# Patient Record
Sex: Male | Born: 1975 | Race: White | Hispanic: No
Health system: Midwestern US, Community
[De-identification: ages and names within clinical notes are randomized; demographics above are authoritative.]

---

## 2000-06-12 ENCOUNTER — Emergency Department (HOSPITAL_COMMUNITY): Admission: EM | Admit: 2000-06-12 | Discharge: 2000-06-12 | Payer: Self-pay | Admitting: Emergency Medicine

## 2003-12-01 ENCOUNTER — Emergency Department (HOSPITAL_COMMUNITY): Admission: EM | Admit: 2003-12-01 | Discharge: 2003-12-01 | Payer: Self-pay | Admitting: Emergency Medicine

## 2003-12-27 ENCOUNTER — Emergency Department (HOSPITAL_COMMUNITY): Admission: AD | Admit: 2003-12-27 | Discharge: 2003-12-27 | Payer: Self-pay | Admitting: Internal Medicine

## 2014-02-25 MED ORDER — IBUPROFEN 800 MG TAB
800 mg | ORAL_TABLET | Freq: Four times a day (QID) | ORAL | Status: AC | PRN
Start: 2014-02-25 — End: 2014-03-04

## 2014-02-25 NOTE — Other (Signed)
Patient is a 38 y.o. male presenting with foot injury. The history is provided by the patient.   Foot Injury   This is a new problem. The current episode started 3 to 5 hours ago. The problem has been gradually improving. The pain is present in the right foot. The quality of the pain is described as aching. The pain is at a severity of 7/10. The pain is moderate. Associated symptoms comments: Pain on walking with local soreness.  And swelling . The symptoms are aggravated by palpation. He has tried cold for the symptoms. There has been a history of trauma (see note).        No past medical history on file.     No past surgical history on file.      No family history on file.     History     Social History   ??? Marital Status: UNKNOWN     Spouse Name: N/A     Number of Children: N/A   ??? Years of Education: N/A     Occupational History   ??? Not on file.     Social History Main Topics   ??? Smoking status: Not on file   ??? Smokeless tobacco: Not on file   ??? Alcohol Use: Not on file   ??? Drug Use: Not on file   ??? Sexual Activity: Not on file     Other Topics Concern   ??? Not on file     Social History Narrative   ??? No narrative on file                ALLERGIES: Review of patient's allergies indicates no known allergies.    Filed Vitals:    02/25/14 1154   BP: 129/72   Pulse: 68   Temp: 98.3 ??F (36.8 ??C)   Resp: 18   Height: 6' (1.829 m)   Weight: 110.224 kg (243 lb)   SpO2: 99%       Physical Exam   Constitutional: No distress.   Musculoskeletal:        Right ankle: Normal.        Right foot: There is tenderness and bony tenderness. There is normal range of motion.        Feet:    Nursing note and vitals reviewed.      MDM    Procedures

## 2014-04-01 MED ORDER — ETODOLAC 400 MG TAB
400 mg | ORAL_TABLET | Freq: Two times a day (BID) | ORAL | Status: AC
Start: 2014-04-01 — End: 2014-04-06

## 2014-04-01 MED ORDER — CYCLOBENZAPRINE 5 MG TAB
5 mg | ORAL_TABLET | Freq: Three times a day (TID) | ORAL | Status: AC
Start: 2014-04-01 — End: 2014-04-08

## 2014-04-01 NOTE — Other (Signed)
Patient is a 38 y.o. male presenting with back pain. The history is provided by the patient.   Back Pain   This is a new problem. Episode onset: lower right side back pain that started monday after bending at work. The problem has not changed since onset.The problem occurs constantly. Patient reports not work related injury.The pain is associated with no known injury. The pain is present in the lumbar spine and right side. The quality of the pain is described as aching. The pain does not radiate. The pain is at a severity of 8/10. The pain is moderate. The symptoms are aggravated by certain positions, bending and twisting. Stiffness is present all day. Pertinent negatives include no chest pain, no fever, no numbness, no weight loss, no headaches, no abdominal pain, no abdominal swelling, no bowel incontinence, no perianal numbness, no bladder incontinence, no dysuria, no pelvic pain, no leg pain, no paresthesias, no paresis, no tingling and no weakness. He has tried nothing for the symptoms. The treatment provided no relief.        No past medical history on file.     No past surgical history on file.      No family history on file.     History     Social History   ??? Marital Status: UNKNOWN     Spouse Name: N/A     Number of Children: N/A   ??? Years of Education: N/A     Occupational History   ??? Not on file.     Social History Main Topics   ??? Smoking status: Not on file   ??? Smokeless tobacco: Not on file   ??? Alcohol Use: Not on file   ??? Drug Use: Not on file   ??? Sexual Activity: Not on file     Other Topics Concern   ??? Not on file     Social History Narrative   ??? No narrative on file                ALLERGIES: Review of patient's allergies indicates no known allergies.    Review of Systems   Constitutional: Negative for fever and weight loss.   Cardiovascular: Negative for chest pain.   Gastrointestinal: Negative for abdominal pain and bowel incontinence.    Genitourinary: Negative for bladder incontinence, dysuria and pelvic pain.   Musculoskeletal: Positive for back pain.   Neurological: Negative for tingling, weakness, numbness, headaches and paresthesias.   All other systems reviewed and are negative.      Filed Vitals:    04/01/14 1432   BP: 122/70   Pulse: 54   Temp: 97.8 ??F (36.6 ??C)   Resp: 18   Height: 6' (1.829 m)   Weight: 110.224 kg (243 lb)   SpO2: 97%       Physical Exam   Constitutional: He is oriented to person, place, and time. He appears well-developed and well-nourished.   HENT:   Head: Normocephalic and atraumatic.   Eyes: Pupils are equal, round, and reactive to light.   Neck: Normal range of motion. Neck supple.   Cardiovascular: Normal rate and regular rhythm.    Pulmonary/Chest: Effort normal and breath sounds normal. No respiratory distress. He has no wheezes.   Abdominal: Soft. Bowel sounds are normal. He exhibits no distension. There is no tenderness. There is no rebound.   Musculoskeletal: Normal range of motion. He exhibits tenderness.   Lower back pain, pain increased upon palpation. Pain more on  The  right than the left.  No deformity.  Negative leg lift. Pain Does not radiate  ROM limited guarded with movement   Neurological: He is alert and oriented to person, place, and time.   No numbness or tingling to lower extremities  Continent of bowel and bladder  +sensation  +pulses   Skin: Skin is warm and dry.   Psychiatric: He has a normal mood and affect. His behavior is normal.   Nursing note and vitals reviewed.      MDM     Differential Diagnosis; Clinical Impression; Plan:     Muscle spasm      Procedures

## 2014-10-10 DIAGNOSIS — F29 Unspecified psychosis not due to a substance or known physiological condition: Secondary | ICD-10-CM | POA: Insufficient documentation

## 2014-10-11 DIAGNOSIS — R748 Abnormal levels of other serum enzymes: Secondary | ICD-10-CM | POA: Insufficient documentation

## 2014-10-11 DIAGNOSIS — R7989 Other specified abnormal findings of blood chemistry: Secondary | ICD-10-CM | POA: Insufficient documentation

## 2014-11-18 DIAGNOSIS — F319 Bipolar disorder, unspecified: Secondary | ICD-10-CM | POA: Insufficient documentation

## 2015-03-09 ENCOUNTER — Ambulatory Visit (INDEPENDENT_AMBULATORY_CARE_PROVIDER_SITE_OTHER): Payer: BLUE CROSS/BLUE SHIELD | Admitting: Sports Medicine

## 2015-03-09 ENCOUNTER — Encounter: Payer: Self-pay | Admitting: Sports Medicine

## 2015-03-09 VITALS — BP 132/84 | HR 94 | Ht 72.0 in | Wt 194.0 lb

## 2015-03-09 DIAGNOSIS — M5136 Other intervertebral disc degeneration, lumbar region: Secondary | ICD-10-CM | POA: Insufficient documentation

## 2015-03-09 DIAGNOSIS — M51369 Other intervertebral disc degeneration, lumbar region without mention of lumbar back pain or lower extremity pain: Secondary | ICD-10-CM | POA: Insufficient documentation

## 2015-03-09 MED ORDER — MELOXICAM 15 MG PO TABS: ORAL_TABLET | ORAL | Status: DC

## 2015-03-09 NOTE — Assessment & Plan Note (Addendum)
With what sounds to be an L4-L5 protrusion. Patient is finishing a taper of prednisone. Adding formal physical therapy, meloxicam. He already had an MRI, through [facility]

## 2015-03-09 NOTE — Progress Notes (Signed)
   Subjective:    I'm seeing this patient as a consultation for:  Dr. Luna Glasgow, [facility]  CC: low back pain  HPI: This is a pleasant 39 year old male, over the past several days to a week he's had increasing low back pain, he was seen in the emergency department where x-rays showed some mild lumbar degenerative disc disease, he was given some narcotics, and referred for outpatient workup. Unfortunately he continued to have pain and demanded an MRI, that apparently showed multilevel degenerative disc disease not unexpectedly, he was referred to surgery switch the referral to me. Pain is improving, he denies any bowel or bladder dysfunction or saddle numbness. Pain is minimally radicular down the left side but not past the knee, and pain is predominantly axial, discogenic, worse with flexion and Valsalva.  Past medical history, Surgical history, Family history not pertinant except as noted below, Social history, Allergies, and medications have been entered into the medical record, reviewed, and no changes needed.   Review of Systems: No headache, visual changes, nausea, vomiting, diarrhea, constipation, dizziness, abdominal pain, skin rash, fevers, chills, night sweats, weight loss, swollen lymph nodes, body aches, joint swelling, muscle aches, chest pain, shortness of breath, mood changes, visual or auditory hallucinations.   Objective:   General: Well Developed, well nourished, and in no acute distress.  Neuro/Psych: Alert and oriented x3, extra-ocular muscles intact, able to move all 4 extremities, sensation grossly intact. Skin: Warm and dry, no rashes noted.  Respiratory: Not using accessory muscles, speaking in full sentences, trachea midline.  Cardiovascular: Pulses palpable, no extremity edema. Abdomen: Does not appear distended. Back Exam:  Inspection: Unremarkable  Motion: Flexion 45 deg, Extension 45 deg, Side Bending to 45 deg bilaterally,  Rotation to 45 deg bilaterally    SLR laying: Negative  XSLR laying: Negative  Palpable tenderness: None. FABER: negative. Sensory change: Gross sensation intact to all lumbar and sacral dermatomes.  Reflexes: 2+ at both patellar tendons, 2+ at achilles tendons, Babinski's downgoing.  Strength at foot  Plantar-flexion: 5/5 Dorsi-flexion: 5/5 Eversion: 5/5 Inversion: 5/5  Leg strength  Quad: 5/5 Hamstring: 5/5 Hip flexor: 5/5 Hip abductors: 5/5  Gait unremarkable.  Impression and Recommendations:   This case required medical decision making of moderate complexity.

## 2015-03-17 ENCOUNTER — Ambulatory Visit: Payer: BLUE CROSS/BLUE SHIELD | Admitting: Rehabilitative and Restorative Service Providers"

## 2015-03-18 ENCOUNTER — Encounter: Payer: Self-pay | Admitting: Physical Therapy

## 2015-03-18 ENCOUNTER — Ambulatory Visit (INDEPENDENT_AMBULATORY_CARE_PROVIDER_SITE_OTHER): Payer: Worker's Compensation | Admitting: Physical Therapy

## 2015-03-18 DIAGNOSIS — M2560 Stiffness of unspecified joint, not elsewhere classified: Secondary | ICD-10-CM | POA: Diagnosis not present

## 2015-03-18 DIAGNOSIS — M545 Low back pain, unspecified: Secondary | ICD-10-CM

## 2015-03-18 DIAGNOSIS — M79605 Pain in left leg: Secondary | ICD-10-CM

## 2015-03-18 DIAGNOSIS — M2569 Stiffness of other specified joint, not elsewhere classified: Secondary | ICD-10-CM

## 2015-03-18 DIAGNOSIS — R29898 Other symptoms and signs involving the musculoskeletal system: Secondary | ICD-10-CM

## 2015-03-18 DIAGNOSIS — M6281 Muscle weakness (generalized): Secondary | ICD-10-CM | POA: Diagnosis not present

## 2015-03-18 NOTE — Therapy (Signed)
[facility]  [place], [place], [address] Phone: [phone]   Fax:  [phone]  Physical Therapy Evaluation  Patient Details  Name: Seth Rose MRN: [id number] Date of Birth: 06-13-1976 Referring Provider:  Silverio Decamp,*  Encounter Date: 03/18/2015      PT End of Session - 03/18/15 1525    Visit Number 1   Number of Visits 8   Date for PT Re-Evaluation 04/15/15   PT Start Time 1525   PT Stop Time 1629   PT Time Calculation (min) 64 min      History reviewed. No pertinent past medical history.  History reviewed. No pertinent past surgical history.  There were no vitals filed for this visit.  Visit Diagnosis:  Lumbar pain with radiation down left leg - Plan: PT plan of care cert/re-cert  Weakness of back - Plan: PT plan of care cert/re-cert  Back stiffness - Plan: PT plan of care cert/re-cert      Subjective Assessment - 03/18/15 1528    Subjective Pt reports back pain that started 2-3 wks ago, missed work last week due to this. 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  He had immediate pain, went to ED. Was issued pain medication and muscle relaxers.    Pertinent History Didn't respond well to pain meds so returned to ED. Had an MRI and received some more medication via IV to help reduce pain and swelling. Was on prednisone also initially.    How long can you sit comfortably? tolerates sitting a lot better now, the transition to standing is difficult.    How long can you walk comfortably? no limitations at this time   Diagnostic tests MRI disc at L4-5   Patient Stated Goals return to normal, move freely.    Currently in Pain? Yes   Pain Score 3    Pain Location Back   Pain Orientation Left   Pain Descriptors / Indicators Aching;Squeezing   Pain Type Acute pain   Pain Radiating Towards will catch at times and pain travel to the  buttocks, occassionally into the Lt LE. Intemittent numbness in his thighs   Pain Onset 1 to 4 weeks ago   Pain Relieving Factors walking and gentle moving around, medications help some, pool exercise.             [facility] PT Assessment - 03/18/15 0001    Assessment   Medical Diagnosis Lumbar DDD   Onset Date/Surgical Date 03/05/15   Hand Dominance Right   Next MD Visit 04/06/15   Prior Therapy none   Precautions   Precautions None   Balance Screen   Has the patient fallen in the past 6 months No   Has the patient had a decrease in activity level because of a fear of falling?  No   Is the patient reluctant to leave their home because of a fear of falling?  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#, alot of walking, driving a fork lift   Leisure swim/exercise in the pool   Observation/Other Assessments   Focus on Therapeutic Outcomes (FOTO)  45% limited   Posture/Postural Control  Posture/Postural Control Postural limitations   Postural Limitations Forward head;Rounded Shoulders  decreased Rt shoulder complex   ROM / Strength   AROM / PROM / Strength AROM;Strength   AROM   AROM Assessment Site Lumbar;Hip   Lumbar Flexion to mid thigh  with pain   Lumbar Extension decreased 50%  minimal lumbar motion   Lumbar - Right Side Bend WNL   Lumbar - Left Side Bend WNL  pain in low back   Lumbar - Right Rotation WNL   Lumbar - Left Rotation WNL   Strength   Overall Strength Comments knees/ankles WNL  multifidis good on Rt , Lt fair.    Strength Assessment Site Hip   Right/Left Hip --  bilat WNL   Flexibility   Soft Tissue Assessment /Muscle Length yes   Hamstrings Rt at 75 degrees, unable to assess Lt d/t (+) SLR    Palpation    Spinal mobility hypomobile in lower lumbar spine.    Palpation comment tightness in Lt lumbar paraspinals, tenderness into the upper gluts   Special Tests    Special Tests Lumbar   Lumbar Tests Slump Test;Straight Leg Raise   Slump test   Findings Positive   Side Left   Straight Leg Raise   Findings Positive   Side  Left   Comment at 30 degrees                   [facility] Adult PT Treatment/Exercise - 03/18/15 0001    Exercises   Exercises Lumbar   Lumbar Exercises: Stretches   Single Knee to Chest Stretch 30 seconds  with pull to opposite shoulder   Lower Trunk Rotation 10 seconds   Press Ups --  10 reps   Lumbar Exercises: Prone   Other Prone Lumbar Exercises pelvic press x 10   Modalities   Modalities Ultrasound   Ultrasound   Ultrasound Location Rt upper gluts   Ultrasound Parameters combo 100%, 3.3 mhz, 1.5 w/cm2   Ultrasound Goals Pain                PT Education - 03/18/15 1607    Education provided Yes   Education Details HEP and basic body mechanics   Person(s) Educated Patient   Methods Explanation;Demonstration;Handout   Comprehension Returned demonstration;Verbalized understanding             PT Long Term Goals - 03/18/15 1730    PT LONG TERM GOAL #1   Title I with advanced HEP ( 04/15/15)   Time 4   Period Weeks   Status New   PT LONG TERM GOAL #2   Title demo full lumbar ROM without pain ( 04/15/15)   Time 4   Period Weeks   Status New   PT LONG TERM GOAL #3   Title demo strong and equal contraction of mulitifidis ( 04/15/15)   Time 4   Period Weeks   Status New   PT LONG TERM GOAL #4   Title improve FOTO =/< 23% (04/15/15)   Time 4   Period Weeks   Status New                Problem List Patient Active Problem List   Diagnosis Date Noted  . Lumbar degenerative disc disease 03/09/2015    Jeral Pinch, PT 03/18/2015, 5:36 PM  [facility]  [place], [place], [address] Phone: [phone]   Fax:  [phone]

## 2015-03-18 NOTE — Patient Instructions (Signed)
Pelvic Press   Place hands under belly between navel and pubic bone, palms up. Feel pressure on hands. Increase pressure on hands by pressing pelvis down. This is NOT a pelvic tilt. Hold _10__ seconds. Relax. Repeat 10___ times. 1-2 times a day  Stretching: Piriformis (Supine)   Pull right knee toward opposite shoulder. Hold _30-45___ seconds. Relax. Repeat _1___ times per set. Do _1___ sets per session. Do __2_ sessions per day.  Lumbar Rotation (Non-Weight Bearing)   Feet on floor, slowly rock knees from side to side in small, pain-free range of motion. Allow lower back to rotate slightly. Repeat __10__ times per set. Do _1___ sets per session. Do ___2_ sessions per day.  Press-Up   Press upper body upward, keeping hips in contact with floor. Keep lower back and buttocks relaxed. Hold _1-2___ seconds. Repeat _10___ times per set. Do __1__ sets per session. Do __2__ sessions per day  Avoid Twisting   Avoid twisting or bending back. Pivot around using foot movements, and bend at knees if needed when reaching for articles.  Getting Into / Out of Bed   Lower self to lie down on one side by raising legs and lowering head at the same time. Use arms to assist moving without twisting. Bend both knees to roll onto back if desired. To sit up, start from lying on side, and use same move-ments in reverse. Keep trunk aligned with legs  Bending    OR HINGE AT [place] at hips and knees, not back. Keep feet shoulder-width apart.   Copyright  VHI. All rights reserved.

## 2015-03-22 ENCOUNTER — Ambulatory Visit (INDEPENDENT_AMBULATORY_CARE_PROVIDER_SITE_OTHER): Payer: Worker's Compensation | Admitting: Rehabilitative and Restorative Service Providers"

## 2015-03-22 ENCOUNTER — Encounter: Payer: Self-pay | Admitting: Rehabilitative and Restorative Service Providers"

## 2015-03-22 DIAGNOSIS — M2569 Stiffness of other specified joint, not elsewhere classified: Secondary | ICD-10-CM

## 2015-03-22 DIAGNOSIS — R29898 Other symptoms and signs involving the musculoskeletal system: Secondary | ICD-10-CM

## 2015-03-22 DIAGNOSIS — M6281 Muscle weakness (generalized): Secondary | ICD-10-CM | POA: Diagnosis not present

## 2015-03-22 DIAGNOSIS — M545 Low back pain: Secondary | ICD-10-CM | POA: Diagnosis not present

## 2015-03-22 DIAGNOSIS — M2560 Stiffness of unspecified joint, not elsewhere classified: Secondary | ICD-10-CM | POA: Diagnosis not present

## 2015-03-22 DIAGNOSIS — M79605 Pain in left leg: Secondary | ICD-10-CM

## 2015-03-22 NOTE — Therapy (Signed)
[facility] [place], [place], [address] Phone: [phone]   Fax:  [phone]  Physical Therapy Treatment  Patient Details  Name: Seth Rose MRN: [id number] Date of Birth: 07-02-1976 Referring Provider:  Silverio Decamp,*  Encounter Date: 03/22/2015      PT End of Session - 03/22/15 1404    Visit Number 2   Number of Visits 8   Date for PT Re-Evaluation 04/15/15   PT Start Time 8101   PT Stop Time 1455   PT Time Calculation (min) 51 min      History reviewed. No pertinent past medical history.  History reviewed. No pertinent past surgical history.  There were no vitals filed for this visit.  Visit Diagnosis:  Lumbar pain with radiation down left leg  Weakness of back  Back stiffness      Subjective Assessment - 03/22/15 1406    Subjective Some improvement; did not sleep well - - up at 2:30 and took  Advil which seemed to help. Still having pain down leg.   Currently in Pain? Yes   Pain Score 4    Pain Location Back   Pain Orientation Left   Pain Descriptors / Indicators Aching   Pain Type Acute pain   Pain Radiating Towards pain into the lt buttock and calf   Pain Onset 1 to 4 weeks ago          [facility] Adult PT Treatment/Exercise - 03/22/15 0001    Lumbar Exercises: Stretches   Press Ups --  10 reps   Piriformis Stretch 3 reps;20 seconds  Lt foot across Rt leg   Lumbar Exercises: Supine   Ab Set --  10 sec hold 3 part core   Lumbar Exercises: Prone   Other Prone Lumbar Exercises pelvic press x 10   Modalities   Modalities Ultrasound   Cryotherapy   Number Minutes Cryotherapy 15 Minutes   Cryotherapy Location Lumbar Spine   Type of Cryotherapy Ice pack   Electrical Stimulation   Electrical Stimulation Location lumbar/Rt hip   Electrical Stimulation Action IFC   Electrical Stimulation Parameters to tolerance   Electrical Stimulation Goals Pain   Ultrasound   Ultrasound Location Rt upper gluts   Ultrasound Parameters combo   Ultrasound Goals Pain           PT Education - 03/22/15 1434    Education provided Yes   Education Details Reviewed exercises; added exercses for home; continued education re positioning   Person(s) Educated Patient   Methods Explanation;Demonstration;Tactile cues;Verbal cues;Handout   Comprehension Verbalized understanding;Returned demonstration           PT Long Term Goals - 03/22/15 1631    PT LONG TERM GOAL #1   Title I with advanced HEP ( 04/15/15)   Time 4   Period Weeks   Status On-going   PT LONG TERM GOAL #2   Title demo full lumbar ROM without pain ( 04/15/15)   Period Weeks   Status On-going   PT LONG TERM GOAL #3   Title demo strong and equal contraction of mulitifidis ( 04/15/15)   Period Weeks   Status On-going   PT LONG TERM GOAL #4   Title improve FOTO =/< 23% (04/15/15)   Time 4   Period Weeks   Status On-going           Plan - 03/22/15 1441    Clinical Impression Statement Patient reports some improvement in symptoms  since last PT visit. He continues to have back and L LE pain.    Pt will benefit from skilled therapeutic intervention in order to improve on the following deficits Decreased range of motion;Decreased endurance;Increased muscle spasms;Decreased activity tolerance;Pain;Impaired flexibility;Improper body mechanics;Decreased mobility;Decreased strength;Postural dysfunction   Rehab Potential Good   PT Frequency 2x / week   PT Duration 4 weeks   PT Treatment/Interventions ADLs/Self Care Home Management;Cryotherapy;Electrical Stimulation;Iontophoresis 4mg /ml Dexamethasone;Moist Heat;Ultrasound;Functional mobility training;Therapeutic activities;Therapeutic exercise;Neuromuscular re-education;Patient/family education;Manual techniques;Passive range of motion;Taping   PT Next Visit Plan Review exercises; progress with lumbar stabilization; spine rehab; modalities as indicated    PT Home Exercise Plan HEP    Consulted and Agree with Plan of Care Patient      Problem List Patient Active Problem List   Diagnosis Date Noted  . Lumbar degenerative disc disease 03/09/2015    Bassheva Flury P Juluis Fitzsimmons,PT, MPH 03/22/2015, 4:34 PM  [facility] [place], [place], [address] Phone: [phone]   Fax:  [phone]

## 2015-03-22 NOTE — Patient Instructions (Signed)
Abdominal Bracing With Pelvic Floor (Hook-Lying)   With neutral spine, tighten pelvic floor and abdominals(suck belly button to back bone), tighten muscles in low back at waist. Hold 10 sec. Repeat _10__ times. Do _several__ times a day.   Piriformis Stretch   Lying on back, bend knee placing foot across opposite leg, hold back flat, pull left knee across your body.  Hold _20_ seconds. Repeat _3___ times. Do _3-4___ sessions per day.

## 2015-03-26 ENCOUNTER — Ambulatory Visit (INDEPENDENT_AMBULATORY_CARE_PROVIDER_SITE_OTHER): Payer: BLUE CROSS/BLUE SHIELD | Admitting: Physical Therapy

## 2015-03-26 DIAGNOSIS — M6281 Muscle weakness (generalized): Secondary | ICD-10-CM | POA: Diagnosis not present

## 2015-03-26 DIAGNOSIS — M2560 Stiffness of unspecified joint, not elsewhere classified: Secondary | ICD-10-CM | POA: Diagnosis not present

## 2015-03-26 DIAGNOSIS — M545 Low back pain: Secondary | ICD-10-CM | POA: Diagnosis not present

## 2015-03-26 DIAGNOSIS — M79605 Pain in left leg: Secondary | ICD-10-CM

## 2015-03-26 DIAGNOSIS — R29898 Other symptoms and signs involving the musculoskeletal system: Secondary | ICD-10-CM

## 2015-03-26 DIAGNOSIS — M2569 Stiffness of other specified joint, not elsewhere classified: Secondary | ICD-10-CM

## 2015-03-26 NOTE — Therapy (Signed)
[place], [place], [address] Phone: [phone]   Fax:  [phone]  Physical Therapy Treatment  Patient Details  Name: NICKOLES GREGORI MRN: [id number] Date of Birth: June 09, 1976 Referring Provider:  Silverio Decamp,*  Encounter Date: 03/26/2015      PT End of Session - 03/26/15 1514    Visit Number 3   Number of Visits 8   Date for PT Re-Evaluation 04/15/15   PT Start Time 1512   PT Stop Time 1620   PT Time Calculation (min) 68 min   Activity Tolerance Patient tolerated treatment well      No past medical history on file.  No past surgical history on file.  There were no vitals filed for this visit.  Visit Diagnosis:  Lumbar pain with radiation down left leg  Weakness of back  Back stiffness      Subjective Assessment - 03/26/15 1514    Subjective Pt reports Lt leg was throbbing in church yesterday.  Pt reports he hasn't had any symptoms in leg; keeping moving has helped.    Currently in Pain? Yes   Pain Score 3    Pain Location Back   Pain Orientation Left;Lower   Pain Descriptors / Indicators Aching;Dull   Pain Radiating Towards not radiating into LE   Aggravating Factors  sitting    Pain Relieving Factors walking around, pool exercise             [facility] PT Assessment - 03/26/15 0001    Assessment   Medical Diagnosis Lumbar DDD   Onset Date/Surgical Date 03/05/15   Hand Dominance Right   Next MD Visit 04/06/15   Prior Therapy none                     [facility] Adult PT Treatment/Exercise - 03/26/15 0001    Transfers   Transfers --  Instructed pt on core engaged with transfers. Returned demo    Lumbar Exercises: Hydrologist 2 reps;30 seconds  each leg; pt reported referred pain LLE   Single Knee to Chest Stretch 30 seconds;2 reps   Lower Trunk Rotation 5 reps;10 seconds  trialed single knee rotation; increased symptoms   Press Ups --  10 reps   Piriformis Stretch 3 reps;30 seconds  each leg; with towel assist in supine   Lumbar Exercises: Aerobic   Stationary Bike NuStep L4: 5.5 min    Lumbar Exercises: Prone   Straight Leg Raise 10 reps  with pelvic press x 10 each leg   Other Prone Lumbar Exercises Pelvic press 5 sec hold x 5; press with unilateral knee flexion x 5 reps each leg (tactile cues required)    Lumbar Exercises: Quadruped   Madcat/Old Horse 5 reps   Madcat/Old Horse Limitations required tactile cues    Cryotherapy   Number Minutes Cryotherapy 12 Minutes   Cryotherapy Location Hip;Lumbar Spine   Type of Cryotherapy Ice pack   Electrical Stimulation   Electrical Stimulation Location lumbar/Rt hip   Electrical Stimulation Action premod to each area   Electrical Stimulation Parameters to tolerance x 12 min    Electrical Stimulation Goals Pain            PT Education - 03/26/15 1610    Education provided Yes   Education Details self care: engage core with transitions (sit to/from stand), log roll supine to/from sit, ice pack to affected area x 15 min as  needed. HEP - added exercises to pelvic press series.    Person(s) Educated Patient   Methods Handout;Explanation   Comprehension Verbalized understanding;Returned demonstration             PT Long Term Goals - 03/22/15 1631    PT LONG TERM GOAL #1   Title I with advanced HEP ( 04/15/15)   Time 4   Period Weeks   Status On-going   PT LONG TERM GOAL #2   Title demo full lumbar ROM without pain ( 04/15/15)   Period Weeks   Status On-going   PT LONG TERM GOAL #3   Title demo strong and equal contraction of mulitifidis ( 04/15/15)   Period Weeks   Status On-going   PT LONG TERM GOAL #4   Title improve FOTO =/< 23% (04/15/15)   Time 4   Period Weeks   Status On-going               Plan - 03/26/15 1614    Clinical Impression Statement Pt tolerated most exercises without increase in symptoms, except supine  Hamstring stretch (increased sciatica into calf).  Pt demo improved engagement of multifidus and trans abdominus with tactile + VC.  Progressing  towards goals.    Pt will benefit from skilled therapeutic intervention in order to improve on the following deficits Decreased range of motion;Decreased endurance;Increased muscle spasms;Decreased activity tolerance;Pain;Impaired flexibility;Improper body mechanics;Decreased mobility;Decreased strength;Postural dysfunction   Rehab Potential Good   PT Frequency 2x / week   PT Duration 4 weeks   PT Treatment/Interventions ADLs/Self Care Home Management;Cryotherapy;Electrical Stimulation;Iontophoresis 4mg /ml Dexamethasone;Moist Heat;Ultrasound;Functional mobility training;Therapeutic activities;Therapeutic exercise;Neuromuscular re-education;Patient/family education;Manual techniques;Passive range of motion;Taping   PT Next Visit Plan Continue progressive core strengthening; modalities as needed.    Consulted and Agree with Plan of Care Patient        Problem List Patient Active Problem List   Diagnosis Date Noted  . Lumbar degenerative disc disease 03/09/2015   Kerin Perna, PTA 03/26/2015 4:26 PM   [place], [place], [address] Phone: [phone]   Fax:  [phone]

## 2015-03-26 NOTE — Patient Instructions (Signed)
Pelvic Press   Place hands under belly between navel and pubic bone, palms up. Feel pressure on hands. Increase pressure on hands by pressing pelvis down. This is NOT a pelvic tilt. Hold __5_ seconds. Relax. Repeat _10__ times.  KNEE: Flexion - Prone   Hold pelvic press. Bend knee. Raise heel toward buttocks. Repeat on opposite leg. Do not raise hips. _10__ reps per set, __1_ sets per day, _3-4__ days per week * When this is mastered, pull both heels up at same time, x 10 reps.    Hip Extension (Prone)  Hold pelvic press  Lift left leg _3___ inches from floor, keeping knee locked. Repeat __10__ times per set. Do _1___ sets per session. Do _10___ sessions per day.  [facility] at [facility] [place], [place] [address]  [phone] (office) [phone] (fax)

## 2015-03-29 ENCOUNTER — Ambulatory Visit (INDEPENDENT_AMBULATORY_CARE_PROVIDER_SITE_OTHER): Payer: Worker's Compensation | Admitting: Physical Therapy

## 2015-03-29 DIAGNOSIS — M545 Low back pain, unspecified: Secondary | ICD-10-CM

## 2015-03-29 DIAGNOSIS — M6281 Muscle weakness (generalized): Secondary | ICD-10-CM | POA: Diagnosis not present

## 2015-03-29 DIAGNOSIS — M79605 Pain in left leg: Secondary | ICD-10-CM

## 2015-03-29 DIAGNOSIS — M2560 Stiffness of unspecified joint, not elsewhere classified: Secondary | ICD-10-CM | POA: Diagnosis not present

## 2015-03-29 DIAGNOSIS — R29898 Other symptoms and signs involving the musculoskeletal system: Secondary | ICD-10-CM

## 2015-03-29 DIAGNOSIS — M2569 Stiffness of other specified joint, not elsewhere classified: Secondary | ICD-10-CM

## 2015-03-29 NOTE — Therapy (Signed)
[place], [place], [address] Phone: [phone]   Fax:  [phone]  Physical Therapy Treatment  Patient Details  Name: Seth Rose MRN: [id number] Date of Birth: [birth year] Referring Provider:  Silverio Decamp,*  Encounter Date: 03/29/2015      PT End of Session - 03/29/15 1604    Visit Number 4   Number of Visits 8   Date for PT Re-Evaluation 04/15/15   PT Start Time 1602   PT Stop Time 1652   PT Time Calculation (min) 50 min   Activity Tolerance Patient tolerated treatment well      No past medical history on file.  No past surgical history on file.  There were no vitals filed for this visit.  Visit Diagnosis:  Lumbar pain with radiation down left leg  Weakness of back  Back stiffness      Subjective Assessment - 03/29/15 1604    Subjective Pt reports he still has symptoms in Lt leg, but not as frequently. Iced with frozen peas, gave some relief.    Currently in Pain? Yes   Pain Score 2    Pain Location Buttocks   Pain Orientation Left   Pain Descriptors / Indicators Aching   Aggravating Factors  sitting   Pain Relieving Factors walking             [facility] PT Assessment - 03/29/15 0001    Assessment   Medical Diagnosis Lumbar DDD   Onset Date/Surgical Date 03/05/15   Hand Dominance Right   Next MD Visit 04/06/15   Prior Therapy none                     [facility] Adult PT Treatment/Exercise - 03/29/15 0001    Lumbar Exercises: Stretches   Passive Hamstring Stretch 2 reps;30 seconds  each leg; pt reported referred pain LLE   Lumbar Exercises: Aerobic   Stationary Bike NuStep L4: 6 min    Lumbar Exercises: Prone   Other Prone Lumbar Exercises Pelvic press 5 sec hold x 5; press with unilateral knee flexion x 10 each leg; 10 with bilateral knee flexion.    Lumbar Exercises: Quadruped   Madcat/Old Horse 5 reps   Single Arm Raise Right;Left;5 reps   Straight Leg Raise 5 reps;1 second  each side    Opposite Arm/Leg Raise 5 reps;Right arm/Left leg;Left arm/Right leg   Modalities   Modalities Traction   Traction   Type of Traction Lumbar   Min (lbs) 30   Max (lbs) 55   Hold Time 60   Rest Time 20   Time 10                     PT Long Term Goals - 03/22/15 1631    PT LONG TERM GOAL #1   Title I with advanced HEP ( 04/15/15)   Time 4   Period Weeks   Status On-going   PT LONG TERM GOAL #2   Title demo full lumbar ROM without pain ( 04/15/15)   Period Weeks   Status On-going   PT LONG TERM GOAL #3   Title demo strong and equal contraction of mulitifidis ( 04/15/15)   Period Weeks   Status On-going   PT LONG TERM GOAL #4   Title improve FOTO =/< 23% (04/15/15)   Time 4   Period Weeks   Status On-going  Plan - 03/29/15 1636    Clinical Impression Statement Pt demonstrated improved core engagement with ther ex. Pt continues to experience symptoms in LLE with supine hamstring stretch.  Pt tolerated trial of traction without increase in symtpms.    Pt will benefit from skilled therapeutic intervention in order to improve on the following deficits Decreased range of motion;Decreased endurance;Increased muscle spasms;Decreased activity tolerance;Pain;Impaired flexibility;Improper body mechanics;Decreased mobility;Decreased strength;Postural dysfunction   Rehab Potential Good   PT Frequency 2x / week   PT Duration 4 weeks   PT Treatment/Interventions ADLs/Self Care Home Management;Cryotherapy;Electrical Stimulation;Iontophoresis 4mg /ml Dexamethasone;Moist Heat;Ultrasound;Functional mobility training;Therapeutic activities;Therapeutic exercise;Neuromuscular re-education;Patient/family education;Manual techniques;Passive range of motion;Taping;Traction  spoke to supervising PT regarding addition of traction to POC.    PT Next Visit Plan Continue progressive core strengthening.  Assess response to lumbar  traction.    Consulted and Agree with Plan of Care Patient     Added traction to PT POC.  Per MD orders, okay to proceed with traction as indicated. Laureen Abrahams, PT, DPT 03/29/2015 5:01 PM   Problem List Patient Active Problem List   Diagnosis Date Noted  . Lumbar degenerative disc disease 03/09/2015   Kerin Perna, PTA 03/29/2015 5:01 PM  Laureen Abrahams, PT, DPT 03/29/2015 5:01 PM  [facility] [place], [place], [address] Phone: [phone]   Fax:  [phone]

## 2015-04-02 ENCOUNTER — Ambulatory Visit (INDEPENDENT_AMBULATORY_CARE_PROVIDER_SITE_OTHER): Payer: Worker's Compensation | Admitting: Physical Therapy

## 2015-04-02 DIAGNOSIS — M79605 Pain in left leg: Secondary | ICD-10-CM

## 2015-04-02 DIAGNOSIS — M2560 Stiffness of unspecified joint, not elsewhere classified: Secondary | ICD-10-CM | POA: Diagnosis not present

## 2015-04-02 DIAGNOSIS — M6281 Muscle weakness (generalized): Secondary | ICD-10-CM

## 2015-04-02 DIAGNOSIS — M545 Low back pain, unspecified: Secondary | ICD-10-CM

## 2015-04-02 DIAGNOSIS — M2569 Stiffness of other specified joint, not elsewhere classified: Secondary | ICD-10-CM

## 2015-04-02 DIAGNOSIS — R29898 Other symptoms and signs involving the musculoskeletal system: Secondary | ICD-10-CM

## 2015-04-02 NOTE — Therapy (Signed)
[place], [place], [address] Phone: [phone]   Fax:  [phone]  Physical Therapy Treatment  Patient Details  Name: Seth Rose MRN: [id number] Date of Birth: 03-22-1976 Referring Provider:  Silverio Decamp,*  Encounter Date: 04/02/2015      PT End of Session - 04/02/15 1541    Visit Number 5   Number of Visits 8   Date for PT Re-Evaluation 04/15/15   PT Start Time 5374   PT Stop Time 1628   PT Time Calculation (min) 53 min   Activity Tolerance Patient limited by pain      No past medical history on file.  No past surgical history on file.  There were no vitals filed for this visit.  Visit Diagnosis:  Lumbar pain with radiation down left leg  Weakness of back  Back stiffness      Subjective Assessment - 04/02/15 1541    Subjective Pt reports his pain has reduced overall as well as the frequency. 7/10 at worse, 0/10 at best if up walking. Pt reports that traction "flared him up" up to 5/10 in afternoon after last session.  He used ice 2x that night to reduce pain. Right now he's experiencing persistant pain in lateral portion of Lt lateral lower leg.    Currently in Pain? Yes   Pain Score 2    Pain Location Calf  and a little in Lt buttocks    Pain Orientation Left;Lateral   Pain Descriptors / Indicators Aching   Aggravating Factors  sitting    Pain Relieving Factors walking             [facility] PT Assessment - 04/02/15 0001    Assessment   Medical Diagnosis Lumbar DDD   Onset Date/Surgical Date 03/05/15   Hand Dominance Right   Next MD Visit 04/06/15   Prior Therapy none   Flexibility   Soft Tissue Assessment /Muscle Length yes   Hamstrings Rt = 68, Lt = 45                     [facility] Adult PT Treatment/Exercise - 04/02/15 0001    Lumbar Exercises: Stretches   Active hamstring stretch LLE hamstring : contract relax 5 sec/10 sec x 5 reps   Passive  Hamstring Stretch 30 seconds;3 reps each leg    Lumbar Exercises: Prone   Other Prone Lumbar Exercises Prone press ups x 10    Other Prone Lumbar Exercises prone pelvic press: attempted bilateral knee flexion -increased sciatica, single leg - increased symptoms on RLE. Activity stopped;.   Lumbar Exercises: Quadruped   Madcat/Old Horse 5 reps  pt reported increased pain with ant pelvic tilt: stopped    Opposite Arm/Leg Raise Right arm/Left leg;Left arm/Right leg;10 reps   Modalities   Modalities Cryotherapy;Electrical Stimulation   Cryotherapy   Number Minutes Cryotherapy 12 Minutes   Cryotherapy Location Lumbar Spine;Hip   Type of Cryotherapy Ice pack   Electrical Stimulation   Electrical Stimulation Location lumbar/Lt hip   Electrical Stimulation Action pre mod to each area    Electrical Stimulation Parameters to tolerance    Electrical Stimulation Goals Pain   Manual Therapy   Manual Therapy Manual Traction   Manual Traction Long leg traction on Lt leg x 30 sec x 6 reps      Nu Step L4: 6 min  PT Long Term Goals - 03/22/15 1631    PT LONG TERM GOAL #1   Title I with advanced HEP ( 04/15/15)   Time 4   Period Weeks   Status On-going   PT LONG TERM GOAL #2   Title demo full lumbar ROM without pain ( 04/15/15)   Period Weeks   Status On-going   PT LONG TERM GOAL #3   Title demo strong and equal contraction of mulitifidis ( 04/15/15)   Period Weeks   Status On-going   PT LONG TERM GOAL #4   Title improve FOTO =/< 23% (04/15/15)   Time 4   Period Weeks   Status On-going               Plan - 04/02/15 1616    Clinical Impression Statement Pt reports 25% improvement in condition since beginning therapy. Pt reported increased symptoms following traction last session; not tolerate - lumbar traction held this date. However, pt reported reduction of sciatic symptoms in LLE with long leg traction.  Pt progressing towards goals, no new goals met  yet.    Pt will benefit from skilled therapeutic intervention in order to improve on the following deficits Decreased range of motion;Decreased endurance;Increased muscle spasms;Decreased activity tolerance;Pain;Impaired flexibility;Improper body mechanics;Decreased mobility;Decreased strength;Postural dysfunction   PT Frequency 2x / week   PT Duration 4 weeks   PT Treatment/Interventions ADLs/Self Care Home Management;Cryotherapy;Electrical Stimulation;Iontophoresis 61m/ml Dexamethasone;Moist Heat;Ultrasound;Functional mobility training;Therapeutic activities;Therapeutic exercise;Neuromuscular re-education;Patient/family education;Manual techniques;Passive range of motion;Taping;Traction   PT Next Visit Plan Continue progressive core strengthening; modalites to reduce radiating LE symptoms.    Consulted and Agree with Plan of Care Patient        Problem List Patient Active Problem List   Diagnosis Date Noted  . Lumbar degenerative disc disease 03/09/2015    JKerin Perna PTA 04/02/2015 4:23 PM  [place] [address]: [phone]  Fax:  [phone]

## 2015-04-06 ENCOUNTER — Encounter: Payer: Self-pay | Admitting: Sports Medicine

## 2015-04-06 ENCOUNTER — Ambulatory Visit (INDEPENDENT_AMBULATORY_CARE_PROVIDER_SITE_OTHER): Payer: BLUE CROSS/BLUE SHIELD | Admitting: Sports Medicine

## 2015-04-06 VITALS — BP 139/93 | HR 105 | Ht 70.0 in | Wt 178.0 lb

## 2015-04-06 DIAGNOSIS — M5136 Other intervertebral disc degeneration, lumbar region: Secondary | ICD-10-CM | POA: Diagnosis not present

## 2015-04-06 DIAGNOSIS — M51369 Other intervertebral disc degeneration, lumbar region without mention of lumbar back pain or lower extremity pain: Secondary | ICD-10-CM

## 2015-04-06 DIAGNOSIS — D229 Melanocytic nevi, unspecified: Secondary | ICD-10-CM | POA: Diagnosis not present

## 2015-04-06 MED ORDER — MELOXICAM 15 MG PO TABS: ORAL_TABLET | ORAL | Status: DC

## 2015-04-06 MED ORDER — AMITRIPTYLINE HCL 50 MG PO TABS: ORAL_TABLET | ORAL | Status: DC

## 2015-04-06 NOTE — Assessment & Plan Note (Signed)
Improving significantly with formal physical therapy. Next line still has some pain particularly at night and in the morning. Meloxicam at bedtime, amitriptyline at bedtime. Return in one month, has a few more weeks of formal therapy.

## 2015-04-06 NOTE — Progress Notes (Signed)
  Subjective:    CC: Follow-up  HPI: Lumbar degenerative disc disease: Significantly better, pain-free today, still has a bit of pain down the left leg with a straight leg raise, overall very happy with how things are going and has several more weeks of physical therapy. Pain is worst at night. He's not taking any medications.  Suspicious nevus: Noted by physical therapist. Wants me to take a look.  Past medical history, Surgical history, Family history not pertinant except as noted below, Social history, Allergies, and medications have been entered into the medical record, reviewed, and no changes needed.   Review of Systems: No fevers, chills, night sweats, weight loss, chest pain, or shortness of breath.   Objective:    General: Well Developed, well nourished, and in no acute distress.  Neuro: Alert and oriented x3, extra-ocular muscles intact, sensation grossly intact.  HEENT: Normocephalic, atraumatic, pupils equal round reactive to light, neck supple, no masses, no lymphadenopathy, thyroid nonpalpable.  Skin: Warm and dry, no rashes. There is a 0.5 cm nevus very benign-appearing. Patient is concerned so this will be biopsied. Cardiac: Regular rate and rhythm, no murmurs rubs or gallops, no lower extremity edema.  Respiratory: Clear to auscultation bilaterally. Not using accessory muscles, speaking in full sentences.  Procedure:  Excision of left posterior calf nevus, 0.57 m Risks, benefits, and alternatives explained and consent obtained. Time out conducted. Surface prepped with alcohol. 5cc lidocaine with epinephine infiltrated in a field block. Adequate anesthesia ensured. Area prepped and draped in a sterile fashion. Excision performed with: 4 mm punch biopsy, incision then closed with a 4-0 simple interrupted proline suture. Hemostasis achieved. Pt stable.  Impression and Recommendations:

## 2015-04-06 NOTE — Assessment & Plan Note (Signed)
Punch biopsy of a tiny suspicious nevus on the posterior left calf. Single 4-0 proline simple interrupted suture placed. Return in 7 days for suture removal.

## 2015-04-07 ENCOUNTER — Ambulatory Visit (INDEPENDENT_AMBULATORY_CARE_PROVIDER_SITE_OTHER): Payer: Worker's Compensation | Admitting: Physical Therapy

## 2015-04-07 DIAGNOSIS — M2569 Stiffness of other specified joint, not elsewhere classified: Secondary | ICD-10-CM

## 2015-04-07 DIAGNOSIS — M2560 Stiffness of unspecified joint, not elsewhere classified: Secondary | ICD-10-CM

## 2015-04-07 DIAGNOSIS — M545 Low back pain, unspecified: Secondary | ICD-10-CM

## 2015-04-07 DIAGNOSIS — M6281 Muscle weakness (generalized): Secondary | ICD-10-CM | POA: Diagnosis not present

## 2015-04-07 DIAGNOSIS — R29898 Other symptoms and signs involving the musculoskeletal system: Secondary | ICD-10-CM

## 2015-04-07 DIAGNOSIS — M79605 Pain in left leg: Secondary | ICD-10-CM

## 2015-04-07 NOTE — Therapy (Signed)
[place], [place], [address] Phone: [phone]   Fax:  [phone]  Physical Therapy Treatment  Patient Details  Name: Seth Rose MRN: [id number] Date of Birth: 07-23-1976 Referring Provider:  Silverio Decamp,*  Encounter Date: 04/07/2015      PT End of Session - 04/07/15 1603    Visit Number 6   Number of Visits 8   Date for PT Re-Evaluation 04/15/15   PT Start Time 0092   PT Stop Time 1656   PT Time Calculation (min) 53 min      No past medical history on file.  No past surgical history on file.  There were no vitals filed for this visit.  Visit Diagnosis:  Lumbar pain with radiation down left leg  Weakness of back  Back stiffness      Subjective Assessment - 04/07/15 1606    Subjective Pt reports pain in Lt buttocks/calf have reduced - 5/10 at worst, 0/10 at best if up walking. Swimming 2 days ago irritated it a little.  Using lumbar support in chair in church. Still icing buttocks as needed.    Currently in Pain? Yes   Pain Score 2    Pain Location Buttocks  and a little in calf    Pain Orientation Left   Pain Descriptors / Indicators Aching;Dull   Aggravating Factors  sitting    Pain Relieving Factors walking, icing             [facility] PT Assessment - 04/07/15 0001    Assessment   Medical Diagnosis Lumbar DDD   Onset Date/Surgical Date 03/05/15   Hand Dominance Right   Next MD Visit 6 wks    AROM   Lumbar Flexion to ankles with minimal pain    Lumbar Extension WNL   Flexibility   Soft Tissue Assessment /Muscle Length yes   Hamstrings Rt: 68, L: 48                      [facility] Adult PT Treatment/Exercise - 04/07/15 0001    Lumbar Exercises: Stretches   Passive Hamstring Stretch 2 reps;60 seconds  LLE with strap    Lumbar Exercises: Aerobic   Stationary Bike NuStep L4: 6 min    Lumbar Exercises: Standing   Other Standing Lumbar Exercises  lumbar ext with hands on hips x 5 reps holding for 2-3 sec  x 2 sets    Other Standing Lumbar Exercises Reverse wood chop with green band x 10 each side;  Anti-rotation with green band (arms out and in from core -trans abd engaged ) x 10 each side.    Lumbar Exercises: Seated   Other Seated Lumbar Exercises Seated on green therapy ball: wood chop with green band x 10 each direction, pelvic tilts ant/post, side-to side, CW/CCW - difficulty isolating and moving lumbar spine. sacral sitting.    Modalities   Modalities Cryotherapy;Electrical Stimulation   Cryotherapy   Number Minutes Cryotherapy 15 Minutes   Cryotherapy Location Lumbar Spine   Type of Cryotherapy Ice pack   Electrical Stimulation   Electrical Stimulation Location lumbar/Lt hip   Electrical Stimulation Action pre-mod   Electrical Stimulation Parameters to tolerance    Electrical Stimulation Goals Pain                     PT Long Term Goals - 04/07/15 1610    PT LONG TERM GOAL #1   Title  I with advanced HEP ( 04/15/15)   Time 4   Period Weeks   Status On-going   PT LONG TERM GOAL #2   Title demo full lumbar ROM without pain ( 04/15/15)   Time 4   Period Weeks   Status Partially Met   PT LONG TERM GOAL #3   Title demo strong and equal contraction of mulitifidis ( 04/15/15)   Time 4   Period Weeks   Status On-going   PT LONG TERM GOAL #4   Title improve FOTO =/< 23% (04/15/15)   Time 4   Period Weeks   Status On-going               Plan - 04/07/15 1620    Clinical Impression Statement Pt demo improved lumbar ROM- able to touch B ankles, however still has some pain with flexion.  Pt demo difficulty with lumbar mobility exercises in quadruped/ on therapy ball despite visual and tactile cues.  Pt tolerated all new exercises with minimal increase in pain; reduced with use of ice and estim. Progressing towards goals.    Pt will benefit from skilled therapeutic intervention in order to improve on the  following deficits Decreased range of motion;Decreased endurance;Increased muscle spasms;Decreased activity tolerance;Pain;Impaired flexibility;Improper body mechanics;Decreased mobility;Decreased strength;Postural dysfunction   Rehab Potential Good   PT Frequency 2x / week   PT Duration 4 weeks   PT Treatment/Interventions ADLs/Self Care Home Management;Cryotherapy;Electrical Stimulation;Iontophoresis 46m/ml Dexamethasone;Moist Heat;Ultrasound;Functional mobility training;Therapeutic activities;Therapeutic exercise;Neuromuscular re-education;Patient/family education;Manual techniques;Passive range of motion;Taping;Traction   PT Next Visit Plan Continue progressive core strengthening; modalites to reduce radiating LE symptoms.    Consulted and Agree with Plan of Care Patient        Problem List Patient Active Problem List   Diagnosis Date Noted  . Suspicious nevus 04/06/2015  . Lumbar degenerative disc disease 03/09/2015    JKerin Perna PTA 04/07/2015 4:50 PM  [place] [address]Phone: [phone]  Fax:  [phone]

## 2015-04-09 ENCOUNTER — Ambulatory Visit (INDEPENDENT_AMBULATORY_CARE_PROVIDER_SITE_OTHER): Payer: Worker's Compensation | Admitting: Physical Therapy

## 2015-04-09 ENCOUNTER — Ambulatory Visit: Payer: Self-pay | Admitting: Sports Medicine

## 2015-04-09 DIAGNOSIS — M6281 Muscle weakness (generalized): Secondary | ICD-10-CM | POA: Diagnosis not present

## 2015-04-09 DIAGNOSIS — M545 Low back pain, unspecified: Secondary | ICD-10-CM

## 2015-04-09 DIAGNOSIS — M2560 Stiffness of unspecified joint, not elsewhere classified: Secondary | ICD-10-CM | POA: Diagnosis not present

## 2015-04-09 DIAGNOSIS — M2569 Stiffness of other specified joint, not elsewhere classified: Secondary | ICD-10-CM

## 2015-04-09 DIAGNOSIS — R29898 Other symptoms and signs involving the musculoskeletal system: Secondary | ICD-10-CM

## 2015-04-09 DIAGNOSIS — M79605 Pain in left leg: Secondary | ICD-10-CM

## 2015-04-09 NOTE — Therapy (Addendum)
[place], [place], [address] Phone: [phone]   Fax:  [phone]  Physical Therapy Treatment  Patient Details  Name: Seth Rose MRN: [id number] Date of Birth: 31-Dec-1975 Referring Provider:  Silverio Decamp,*  Encounter Date: 04/09/2015      PT End of Session - 04/09/15 1543    Visit Number 7   Number of Visits 8   Date for PT Re-Evaluation 04/15/15   PT Start Time 2197   PT Stop Time 1626   PT Time Calculation (min) 45 min   Activity Tolerance No increased pain;Patient tolerated treatment well      No past medical history on file.  No past surgical history on file.  There were no vitals filed for this visit.  Visit Diagnosis:  Lumbar pain with radiation down left leg  Weakness of back  Back stiffness      Subjective Assessment - 04/09/15 1543    Subjective Pt reports he feels like pain is coming from muscle in buttocks.  Has been working on stretching last few days.    Currently in Pain? Yes   Pain Score 2    Pain Location Buttocks   Pain Orientation Left   Pain Descriptors / Indicators Aching;Dull            [facility] PT Assessment - 04/09/15 0001    Assessment   Medical Diagnosis Lumbar DDD   Onset Date/Surgical Date 03/05/15   Hand Dominance Right   Next MD Visit 6 wks                      [facility] Adult PT Treatment/Exercise - 04/09/15 0001    Lumbar Exercises: Stretches   Piriformis Stretch 2 reps;20 seconds  LLE, seated   Lumbar Exercises: Aerobic   Stationary Bike NuStep L4: 6 min    Lumbar Exercises: Prone   Other Prone Lumbar Exercises quadruped: fire hydrant x 10 each leg    Other Prone Lumbar Exercises prone pelvic press with bliateral knee flexion x 10 reps. prone on elbows x 2 min x 2 reps.    Lumbar Exercises: Quadruped   Madcat/Old Horse 10 reps   Opposite Arm/Leg Raise Right arm/Left leg;Left arm/Right leg;10 reps   Modalities    Modalities Ultrasound;Catering manager Parameters to tolerance    Electrical Stimulation Goals Pain   Ultrasound   Ultrasound Location Lt glute/ piriformis    Ultrasound Parameters 1.2 w/cm2, 1.0 mHz, 100%, 8 min    Ultrasound Goals Pain                     PT Long Term Goals - 04/07/15 1610    PT LONG TERM GOAL #1   Title I with advanced HEP ( 04/15/15)   Time 4   Period Weeks   Status On-going   PT LONG TERM GOAL #2   Title demo full lumbar ROM without pain ( 04/15/15)   Time 4   Period Weeks   Status Partially Met   PT LONG TERM GOAL #3   Title demo strong and equal contraction of mulitifidis ( 04/15/15)   Time 4   Period Weeks   Status On-going   PT LONG TERM GOAL #4   Title improve FOTO =/< 23% (04/15/15)   Time 4   Period  Weeks   Status On-going               Plan - 04/09/15 1629    Clinical Impression Statement Pt had great response to [place] combo to Lt piriformis at end of session, noting resolution of glute symptoms.  Pt also reported decrease in Lt glute symptoms during fire hydrant and POE exercises. Progressing well towards goals.    Pt will benefit from skilled therapeutic intervention in order to improve on the following deficits Decreased range of motion;Decreased endurance;Increased muscle spasms;Decreased activity tolerance;Pain;Impaired flexibility;Improper body mechanics;Decreased mobility;Decreased strength;Postural dysfunction   Rehab Potential Good   PT Frequency 2x / week   PT Duration 4 weeks   PT Treatment/Interventions ADLs/Self Care Home Management;Cryotherapy;Electrical Stimulation;Iontophoresis 106m/ml Dexamethasone;Moist Heat;Ultrasound;Functional mobility training;Therapeutic activities;Therapeutic exercise;Neuromuscular re-education;Patient/family education;Manual  techniques;Passive range of motion;Taping;Traction   PT Next Visit Plan Continue progressive core strengthening; modalites to reduce radiating LE symptoms.  Reassess, end of approved visits; recert if further therapy needed.    Consulted and Agree with Plan of Care Patient        Problem List Patient Active Problem List   Diagnosis Date Noted  . Suspicious nevus 04/06/2015  . Lumbar degenerative disc disease 03/09/2015    JKerin Perna PTA 04/09/2015 4:37 PM  [address] [address]Phone: [phone]  Fax:  [phone]

## 2015-04-12 ENCOUNTER — Ambulatory Visit (INDEPENDENT_AMBULATORY_CARE_PROVIDER_SITE_OTHER): Payer: Worker's Compensation | Admitting: Physical Therapy

## 2015-04-12 ENCOUNTER — Encounter: Payer: Self-pay | Admitting: Physical Therapy

## 2015-04-12 DIAGNOSIS — M2560 Stiffness of unspecified joint, not elsewhere classified: Secondary | ICD-10-CM | POA: Diagnosis not present

## 2015-04-12 DIAGNOSIS — M7918 Myalgia, other site: Secondary | ICD-10-CM

## 2015-04-12 DIAGNOSIS — M6281 Muscle weakness (generalized): Secondary | ICD-10-CM | POA: Diagnosis not present

## 2015-04-12 DIAGNOSIS — M791 Myalgia: Secondary | ICD-10-CM | POA: Diagnosis not present

## 2015-04-12 DIAGNOSIS — R29898 Other symptoms and signs involving the musculoskeletal system: Secondary | ICD-10-CM

## 2015-04-12 DIAGNOSIS — M2569 Stiffness of other specified joint, not elsewhere classified: Secondary | ICD-10-CM

## 2015-04-12 NOTE — Therapy (Addendum)
[place], [place], [address] Phone: [phone]   Fax:  [phone]  Physical Therapy Treatment  Patient Details  Name: Seth Rose MRN: [id number] Date of Birth: 18-Dec-1975 Referring Provider:  Silverio Decamp,*  Encounter Date: 04/12/2015      PT End of Session - 04/12/15 1614    Visit Number 8   Number of Visits 16   Date for PT Re-Evaluation 05/07/15   PT Start Time 7416   PT Stop Time 1700   PT Time Calculation (min) 51 min   Activity Tolerance No increased pain;Patient tolerated treatment well      History reviewed. No pertinent past medical history.  History reviewed. No pertinent past surgical history.  There were no vitals filed for this visit.  Visit Diagnosis:  Weakness of back - Plan: PT plan of care cert/re-cert  Back stiffness - Plan: PT plan of care cert/re-cert  Gluteal pain - Plan: PT plan of care cert/re-cert      Subjective Assessment - 04/12/15 1611    Subjective Pt reports he feels " on fire" today. Teh back doesn't bother him really it is all the Lt LE.    How long can you sit comfortably? able to transition sit to/from stand without difficulties now   How long can you stand comfortably? < 5 minutes   How long can you walk comfortably? no limitations at this time   Pain Score 7    Pain Location Buttocks   Pain Orientation Left   Pain Type Acute pain   Pain Radiating Towards into Lt calf   Pain Onset More than a month ago   Aggravating Factors  standing still    Pain Relieving Factors sitting now            [facility] PT Assessment - 04/12/15 0001    Assessment   Medical Diagnosis Lumbar DDD   Onset Date/Surgical Date 03/05/15   Hand Dominance Right   Next MD Visit 04/12/15   Observation/Other Assessments   Focus on Therapeutic Outcomes (FOTO)  41% limited   AROM   Lumbar Flexion to ankles with minimal pain    Lumbar Extension WNL   Flexibility   Soft Tissue Assessment /Muscle Length yes   Hamstrings Lt 67 degrees, Rt 68                     [facility] Adult PT Treatment/Exercise - 04/12/15 0001    Lumbar Exercises: Aerobic   Stationary Bike NuStep L4: 6 min    Lumbar Exercises: Supine   Other Supine Lumbar Exercises foam roller to roll out Lt gluts   Cryotherapy   Number Minutes Cryotherapy 12 Minutes   Cryotherapy Location --  Lt glut   Type of Cryotherapy Ice pack   Ultrasound   Ultrasound Location Lt glut   Ultrasound Parameters combo, 100% 1.2mz, 1.5w/cm2   Ultrasound Goals Pain   Manual Therapy   Manual Therapy Joint mobilization;Myofascial release   Joint Mobilization Lt posterior hip mobs, grade III   Myofascial Release TPR to Lt piriformis and gluts, very tender in Lt glut med                     PT Long Term Goals - 04/12/15 1655    PT LONG TERM GOAL #1   Title I with advanced HEP ( 05/07/15)   Time 4   Period Weeks   Status On-going  PT LONG TERM GOAL #2   Title demo full lumbar ROM without pain ( 04/15/15)   Status Achieved   PT LONG TERM GOAL #3   Title demo strong and equal contraction of mulitifidis ( 05/07/15)   Period Weeks   Status On-going   PT LONG TERM GOAL #4   Title improve FOTO =/< 23% (05/07/15)  scored 41% limited   Time 4   Period Weeks   Status On-going               Plan - 04/12/15 1702    Clinical Impression Statement Pt presents  with localizing pain in the Lt glut med area.  He responds well to TPR in this are and combo US/stim.  Began course of iontophoresis to this area and will see if this helps .  He is progressing towards his goals, the back has greatly improved and the pain is localized to the gluts now.    Rehab Potential Good   PT Frequency 2x / week   PT Treatment/Interventions ADLs/Self Care Home Management;Cryotherapy;Electrical Stimulation;Iontophoresis 53m/ml Dexamethasone;Moist Heat;Ultrasound;Functional mobility  training;Therapeutic activities;Therapeutic exercise;Neuromuscular re-education;Patient/family education;Manual techniques;Passive range of motion;Taping;Traction   PT Next Visit Plan assess tolerance to ionto.    Consulted and Agree with Plan of Care Patient        Problem List Patient Active Problem List   Diagnosis Date Noted  . Suspicious nevus 04/06/2015  . Lumbar degenerative disc disease 03/09/2015    SJeral Pinch PT 04/12/2015, 5:06 PM  [facility] [address]Phone: [phone]  Fax:  [phone]    PHYSICAL THERAPY DISCHARGE SUMMARY  Visits from Start of Care: 8  Current functional level related to goals / functional outcomes: See above   Remaining deficits: Pt continued to have symptoms, more visits were requested from work comp however they were denied   Education / Equipment: HEP Plan:                                                    Patient goals were partially met. Patient is being discharged due to                                                     [facility]?   SJeral Pinch PT 05/13/2015 10:12 AM

## 2015-04-13 ENCOUNTER — Ambulatory Visit (INDEPENDENT_AMBULATORY_CARE_PROVIDER_SITE_OTHER): Payer: BLUE CROSS/BLUE SHIELD | Admitting: Sports Medicine

## 2015-04-13 VITALS — BP 123/91 | HR 98

## 2015-04-13 DIAGNOSIS — D229 Melanocytic nevi, unspecified: Secondary | ICD-10-CM

## 2015-04-13 DIAGNOSIS — Z4802 Encounter for removal of sutures: Secondary | ICD-10-CM

## 2015-04-13 NOTE — Assessment & Plan Note (Signed)
Sutures removed as above.

## 2015-04-13 NOTE — Progress Notes (Signed)
   Subjective:    Patient ID: Seth Rose, male    DOB: September 08, 1976, 39 y.o.   MRN: [id number]  HPI  Suture removed from left lower leg. Denies fever, chills or sweats.    Review of Systems     Objective:   Physical Exam        Assessment & Plan:  Suture removal - no redness or swelling at the suture site. Removed 1 suture.

## 2015-04-16 ENCOUNTER — Encounter: Payer: BLUE CROSS/BLUE SHIELD | Admitting: Physical Therapy

## 2015-05-04 ENCOUNTER — Ambulatory Visit: Payer: Self-pay | Admitting: Sports Medicine

## 2015-06-03 ENCOUNTER — Ambulatory Visit (INDEPENDENT_AMBULATORY_CARE_PROVIDER_SITE_OTHER): Payer: BLUE CROSS/BLUE SHIELD | Admitting: Sports Medicine

## 2015-06-03 ENCOUNTER — Encounter: Payer: Self-pay | Admitting: Sports Medicine

## 2015-06-03 DIAGNOSIS — M5136 Other intervertebral disc degeneration, lumbar region: Secondary | ICD-10-CM

## 2015-06-03 DIAGNOSIS — M51369 Other intervertebral disc degeneration, lumbar region without mention of lumbar back pain or lower extremity pain: Secondary | ICD-10-CM

## 2015-06-03 NOTE — Progress Notes (Signed)
  Subjective:    CC: Follow-up  HPI: Left lumbar radiculopathy: Seth Rose returns, he's had over one month of physical therapy, he had a work-related injury in early June 2016. Initially he improved but now is having a recurrence of pain, with axial low back pain and radicular symptoms down the left leg in an L5 distribution. He did have an early MRI that showed L4-5 and L5-S1 disc protrusions that appear to contact the left L5 nerve root, and composed of left L4 nerve root. Unfortunately he is not better and amenable to proceed to the next step. His Worker's Comp. claim is being denied and is going to mediation, I'm happy to support his injury being covered with Worker's Comp.  Past medical history, Surgical history, Family history not pertinant except as noted below, Social history, Allergies, and medications have been entered into the medical record, reviewed, and no changes needed.   Review of Systems: No fevers, chills, night sweats, weight loss, chest pain, or shortness of breath.   Objective:    General: Well Developed, well nourished, and in no acute distress.  Neuro: Alert and oriented x3, extra-ocular muscles intact, sensation grossly intact.  HEENT: Normocephalic, atraumatic, pupils equal round reactive to light, neck supple, no masses, no lymphadenopathy, thyroid nonpalpable.  Skin: Warm and dry, no rashes. Cardiac: Regular rate and rhythm, no murmurs rubs or gallops, no lower extremity edema.  Respiratory: Clear to auscultation bilaterally. Not using accessory muscles, speaking in full sentences. Back Exam:  Inspection: Unremarkable  Motion: Flexion 45 deg, Extension 45 deg, Side Bending to 45 deg bilaterally,  Rotation to 45 deg bilaterally  SLR laying: Positive  XSLR laying: Negative  Palpable tenderness: None. FABER: negative. Sensory change: Gross sensation intact to all lumbar and sacral dermatomes.  Reflexes: 2+ at both patellar tendons, 2+ at achilles tendons, Babinski's  downgoing.  Strength at foot  Plantar-flexion: 5/5 Dorsi-flexion: 5/5 Eversion: 5/5 Inversion: 5/5  Leg strength  Quad: 5/5 Hamstring: 5/5 Hip flexor: 5/5 Hip abductors: 5/5  Gait unremarkable.  MRI personally reviewed, there are L4-5 and L5-S1 disc intrusions with clear desiccation, and fairly severe loss of disc height at the L5-S1 level, the L4-L5 disc protrusion does appear to contact the exiting left L5 nerve root and approached the left L4 nerve root.  Impression and Recommendations:

## 2015-06-03 NOTE — Assessment & Plan Note (Signed)
Persistent left L5 radiculopathy despite formal physical therapy, oral medications, muscle relaxers, and steroids. At this point we are going to proceed with a left-sided L5-S1 interlaminar epidural with medication injected up to the L4-L5 level. I would like to see the patient back 4 weeks after his injection for further evaluation.

## 2015-06-08 ENCOUNTER — Inpatient Hospital Stay: Admission: RE | Admit: 2015-06-08 | Payer: Self-pay | Source: Ambulatory Visit

## 2015-06-08 ENCOUNTER — Other Ambulatory Visit: Payer: Self-pay

## 2015-06-09 ENCOUNTER — Ambulatory Visit
Admission: RE | Admit: 2015-06-09 | Discharge: 2015-06-09 | Disposition: A | Discharge status: Home / Self Care | Payer: BLUE CROSS/BLUE SHIELD | Source: Ambulatory Visit | Attending: Sports Medicine | Admitting: Sports Medicine

## 2015-06-09 IMAGING — XA DG EPIDUROGRAM S+I
1 series · 2 of 2 positions shown · non-contrast
Comparison: none

CLINICAL DATA: Lumbosacral spondylosis without myelopathy. LEFT leg
radicular symptoms.

[Series 1: ortho standard · 2 of 2 slices shown]
[im 1/2]
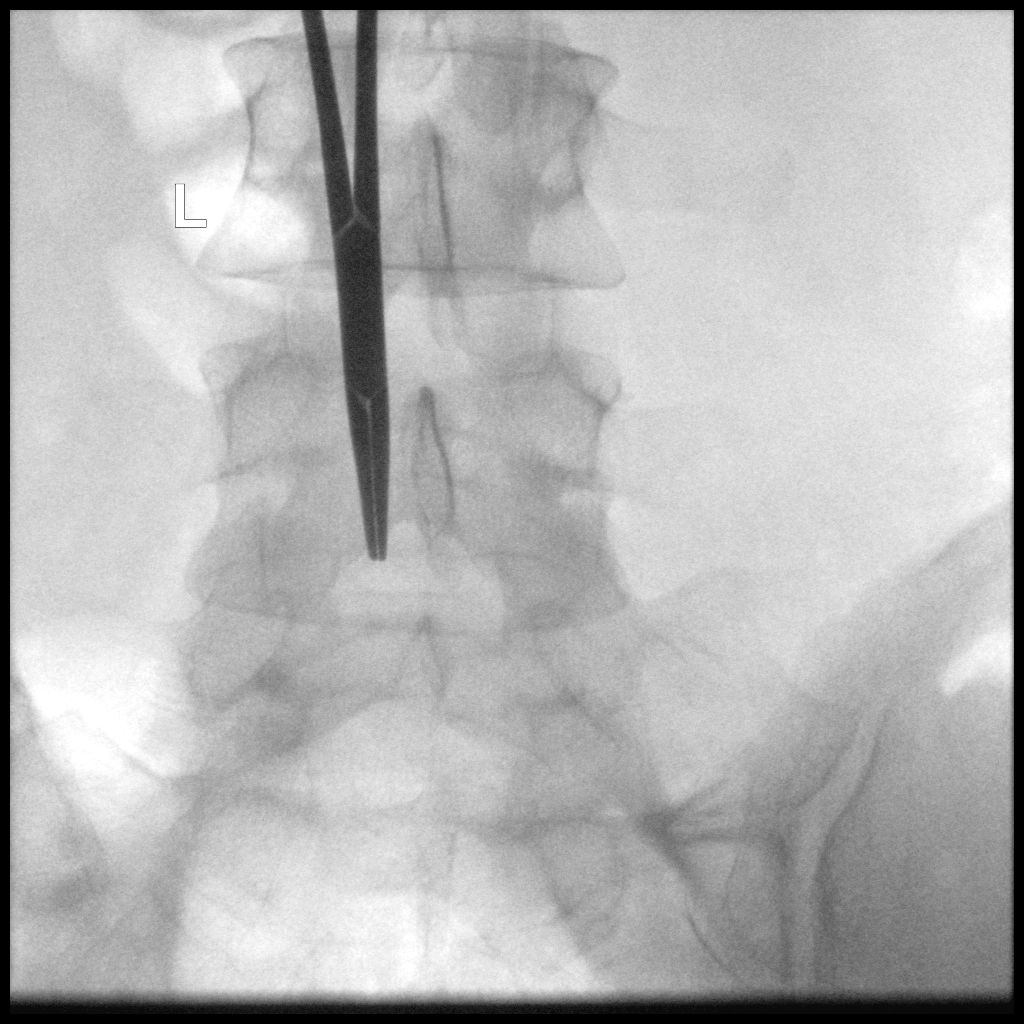
[im 2/2]
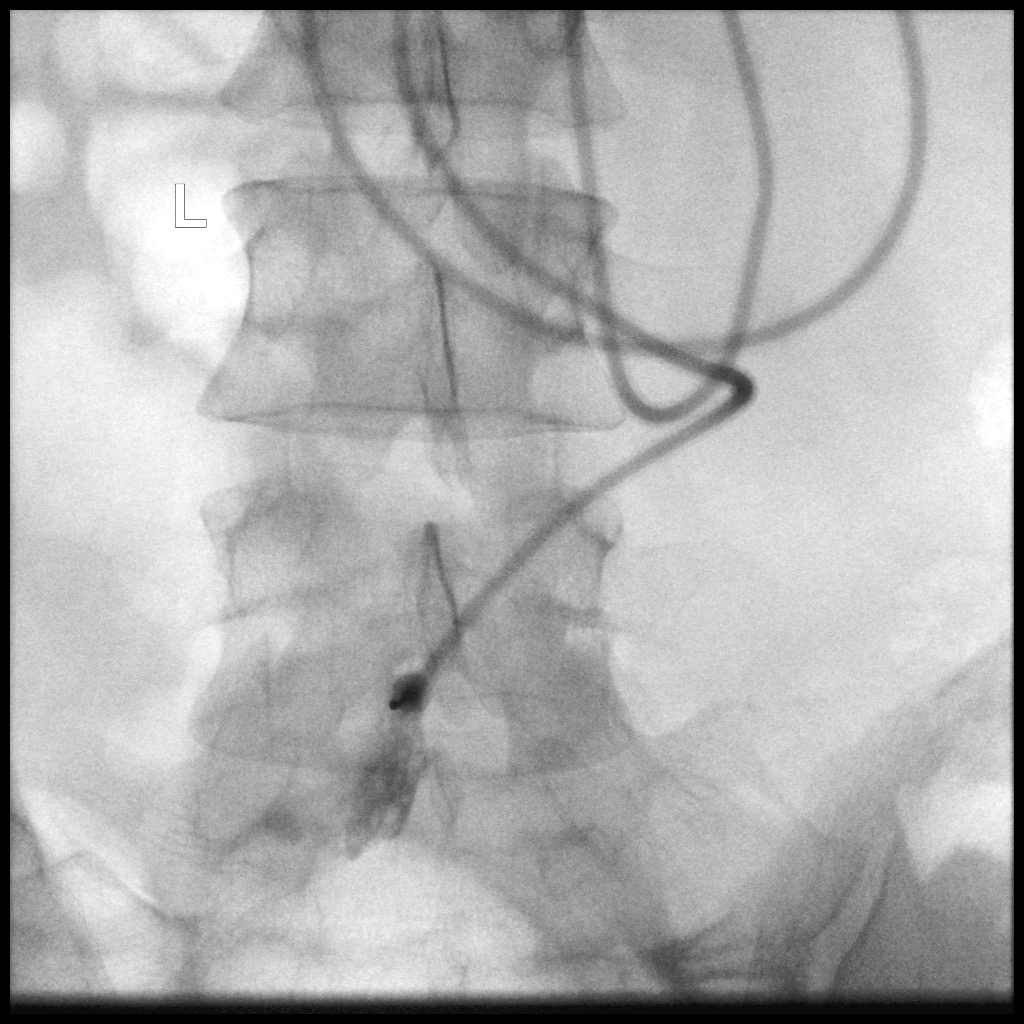

[2 of 2 positions shown; findings below may reference images not displayed]

EXAM:
LUMBAR INTERLAMINAR EPIDURAL INJECTION

FLUOROSCOPY TIME:  12 seconds corresponding to a Dose Area Product
of 90 ?Gy*m2

PROCEDURE:
Procedure: After a thorough discussion of risks and benefits of the
procedure, written and verbal consent was obtained. Specific risks
included puncture of the thecal sac and dura as well as
nontherapeutic injection with general risks of bleeding, infection,
injury to nerves, blood vessels, and adjacent structures. Time out
form was completed. Verbal consent was obtained by Dr. GEIO. We
discussed the moderate likelihood of moderate lasting
relief/attainment of therapeutic goal.

Transitional anatomy is present. There is a sacralized RIGHT L5
transverse process (arrow) which results in hypoplasia of the L5-S1
interspace. Compressive pathology is observed, based on this
numbering scheme, at L4-5 on the LEFT on the patient's outside MRI
from [facility].

The overlying skin was cleansed with betadine soap and anesthetized
with 1% lidocaine without epinephrine. An interlaminar approach was
performed at L4-5 on the LEFT. 20 gauge needle was advanced using
loss-of-resistance technique.

DIAGNOSTIC/THERAPUETIC EPIDURAL INJECTION: Injection of Omnipaque
180 shows a good epidural pattern with spread above and below the
level of needle placement, primarily on the side of needle
placement. No vascular or subarachnoid opacification was seen. 120
mg of Depo-Medrol mixed with 5 cc of 1% Lidocaine were instilled.
The procedure was well-tolerated, and the patient was discharged
thirty minutes following the injection in good condition.
IMPRESSION: Technically successful first lumbar interlaminar epidural injection
at L4-5, LEFT.

See discussion above regarding transitional anatomy..

## 2015-06-09 MED ORDER — METHYLPREDNISOLONE ACETATE 40 MG/ML INJ SUSP (RADIOLOG
120.0000 mg | Freq: Once | INTRAMUSCULAR | Status: AC
  Administered 2015-06-09: 120 mg via EPIDURAL

## 2015-06-09 MED ORDER — IOHEXOL 180 MG/ML  SOLN
1.0000 mL | Freq: Once | INTRAMUSCULAR | Status: DC | PRN
  Administered 2015-06-09: 1 mL via EPIDURAL

## 2015-06-09 NOTE — Discharge Instructions (Signed)

## 2015-08-23 ENCOUNTER — Ambulatory Visit: Payer: Self-pay | Admitting: Sports Medicine

## 2015-08-25 ENCOUNTER — Encounter: Payer: Self-pay | Admitting: Sports Medicine

## 2015-08-25 ENCOUNTER — Ambulatory Visit (INDEPENDENT_AMBULATORY_CARE_PROVIDER_SITE_OTHER): Payer: BLUE CROSS/BLUE SHIELD | Admitting: Sports Medicine

## 2015-08-25 VITALS — BP 137/90 | HR 86

## 2015-08-25 DIAGNOSIS — M51369 Other intervertebral disc degeneration, lumbar region without mention of lumbar back pain or lower extremity pain: Secondary | ICD-10-CM

## 2015-08-25 DIAGNOSIS — G5702 Lesion of sciatic nerve, left lower limb: Secondary | ICD-10-CM | POA: Insufficient documentation

## 2015-08-25 DIAGNOSIS — M5136 Other intervertebral disc degeneration, lumbar region: Secondary | ICD-10-CM

## 2015-08-25 MED ORDER — TRAMADOL HCL 50 MG PO TABS: ORAL_TABLET | ORAL | Status: AC

## 2015-08-25 NOTE — Assessment & Plan Note (Addendum)
Does continue to have some radicular pain with straight leg raise, but also has most of his pain to palpation at the distal insertion of the gluteus medius/piriformis. I did a gluteus medius/piriformis injection under ultrasound guidance today for diagnostic and therapeutic purposes. I also placed some medication in the ischial bursa. Return to see me in one month, I am also going to set him up to see our spine surgeon.

## 2015-08-25 NOTE — Assessment & Plan Note (Signed)
Persistent left-sided lumbar radiculopathy, he did have transitional anatomy so epidural injection was at the L4-L5 level at the side of the worst compressive pathology. He noted no response, not even temporary. At this point he does become a candidate for surgical intervention, based on my review of his history, this is likely a work-related injury.

## 2015-08-25 NOTE — Progress Notes (Signed)
  Subjective:    CC: Follow-up  HPI: This is a pleasant 39 year old male with a work-related injury, she ended up with left-sided lumbar radiculopathy, ultimately he progressed through conservative measures without improvement so we proceeded with a left-sided L4-L5 lumbar epidural that provided no relief, not even temporarily. Still has some pain that he localizes deep in the left buttock, moderate, persistent.  Past medical history, Surgical history, Family history not pertinant except as noted below, Social history, Allergies, and medications have been entered into the medical record, reviewed, and no changes needed.   Review of Systems: No fevers, chills, night sweats, weight loss, chest pain, or shortness of breath.   Objective:    General: Well Developed, well nourished, and in no acute distress.  Neuro: Alert and oriented x3, extra-ocular muscles intact, sensation grossly intact.  HEENT: Normocephalic, atraumatic, pupils equal round reactive to light, neck supple, no masses, no lymphadenopathy, thyroid nonpalpable.  Skin: Warm and dry, no rashes. Cardiac: Regular rate and rhythm, no murmurs rubs or gallops, no lower extremity edema.  Respiratory: Clear to auscultation bilaterally. Not using accessory muscles, speaking in full sentences. Left Hip: ROM IR: 60 Deg, ER: 60 Deg, Flexion: 120 Deg, Extension: 100 Deg, Abduction: 45 Deg, Adduction: 45 Deg Strength IR: 5/5, ER: 5/5, Flexion: 5/5, Extension: 5/5, Abduction: 5/5, Adduction: 5/5 Pelvic alignment unremarkable to inspection and palpation. Standing hip rotation and gait without trendelenburg / unsteadiness. Greater trochanter without tenderness to palpation. There is tenderness at the piriformis insertion, approximately to the ischial tuberosity. No SI joint tenderness and normal minimal SI movement.  Procedure: Real-time Ultrasound Guided Injection of left ischial bursa/piriformis sheath Device: GE Logiq E  Verbal informed  consent obtained.  Time-out conducted.  Noted no overlying erythema, induration, or other signs of local infection.  Skin prepped in a sterile fashion.  Local anesthesia: Topical Ethyl chloride.  With sterile technique and under real time ultrasound guidance:  Spinal needle advanced to the ischial tuberosity, half of the medication injected, I then redirected needle to the piriformis sheath and injected the remainder, total of 1 mL Kenalog 3, 4 mL lidocaine was injected. Completed without difficulty  Pain there was not immediate resolution of pain suggesting the lumbar spine as the principal pain generator. Advised to call if fevers/chills, erythema, induration, drainage, or persistent bleeding.  Images permanently stored and available for review in the ultrasound unit.  Impression: Technically successful ultrasound guided injection.  Impression and Recommendations:

## 2015-09-23 ENCOUNTER — Ambulatory Visit: Payer: Self-pay | Admitting: Sports Medicine

## 2015-09-23 ENCOUNTER — Encounter: Payer: Self-pay | Admitting: Sports Medicine

## 2020-04-19 DIAGNOSIS — K632 Fistula of intestine: Secondary | ICD-10-CM | POA: Diagnosis not present

## 2020-04-19 DIAGNOSIS — S39011A Strain of muscle, fascia and tendon of abdomen, initial encounter: Secondary | ICD-10-CM | POA: Diagnosis not present

## 2020-06-10 DIAGNOSIS — Z933 Colostomy status: Secondary | ICD-10-CM | POA: Diagnosis not present

## 2020-07-14 DIAGNOSIS — Z933 Colostomy status: Secondary | ICD-10-CM | POA: Diagnosis not present

## 2020-08-12 DIAGNOSIS — Z933 Colostomy status: Secondary | ICD-10-CM | POA: Diagnosis not present

## 2020-09-13 DIAGNOSIS — Z933 Colostomy status: Secondary | ICD-10-CM | POA: Diagnosis not present

## 2020-10-12 DIAGNOSIS — Z933 Colostomy status: Secondary | ICD-10-CM | POA: Diagnosis not present

## 2020-11-22 DIAGNOSIS — Z933 Colostomy status: Secondary | ICD-10-CM | POA: Diagnosis not present

## 2020-11-23 DIAGNOSIS — Z933 Colostomy status: Secondary | ICD-10-CM | POA: Diagnosis not present

## 2020-12-20 DIAGNOSIS — Z933 Colostomy status: Secondary | ICD-10-CM | POA: Diagnosis not present

## 2021-01-14 DIAGNOSIS — Z933 Colostomy status: Secondary | ICD-10-CM | POA: Diagnosis not present

## 2021-02-08 DIAGNOSIS — Z933 Colostomy status: Secondary | ICD-10-CM | POA: Diagnosis not present

## 2021-03-09 DIAGNOSIS — Z933 Colostomy status: Secondary | ICD-10-CM | POA: Diagnosis not present

## 2021-04-11 DIAGNOSIS — Z933 Colostomy status: Secondary | ICD-10-CM | POA: Diagnosis not present

## 2021-05-23 DIAGNOSIS — Z933 Colostomy status: Secondary | ICD-10-CM | POA: Diagnosis not present

## 2021-05-24 DIAGNOSIS — Z933 Colostomy status: Secondary | ICD-10-CM | POA: Diagnosis not present

## 2021-06-30 DIAGNOSIS — Z933 Colostomy status: Secondary | ICD-10-CM | POA: Diagnosis not present

## 2021-07-02 DIAGNOSIS — Z933 Colostomy status: Secondary | ICD-10-CM | POA: Diagnosis not present

## 2021-07-27 DIAGNOSIS — Z933 Colostomy status: Secondary | ICD-10-CM | POA: Diagnosis not present

## 2021-07-28 DIAGNOSIS — Z933 Colostomy status: Secondary | ICD-10-CM | POA: Diagnosis not present

## 2021-08-24 DIAGNOSIS — Z933 Colostomy status: Secondary | ICD-10-CM | POA: Diagnosis not present

## 2021-08-26 DIAGNOSIS — Z933 Colostomy status: Secondary | ICD-10-CM | POA: Diagnosis not present

## 2021-08-29 DIAGNOSIS — H5213 Myopia, bilateral: Secondary | ICD-10-CM | POA: Diagnosis not present

## 2021-08-29 DIAGNOSIS — H2512 Age-related nuclear cataract, left eye: Secondary | ICD-10-CM | POA: Diagnosis not present

## 2021-10-28 DIAGNOSIS — Z933 Colostomy status: Secondary | ICD-10-CM | POA: Diagnosis not present

## 2021-10-31 DIAGNOSIS — Z933 Colostomy status: Secondary | ICD-10-CM | POA: Diagnosis not present

## 2021-11-21 DIAGNOSIS — Z933 Colostomy status: Secondary | ICD-10-CM | POA: Diagnosis not present

## 2021-12-12 DIAGNOSIS — Z933 Colostomy status: Secondary | ICD-10-CM | POA: Diagnosis not present

## 2021-12-13 DIAGNOSIS — Z933 Colostomy status: Secondary | ICD-10-CM | POA: Diagnosis not present

## 2022-01-16 DIAGNOSIS — Z933 Colostomy status: Secondary | ICD-10-CM | POA: Diagnosis not present

## 2022-02-28 DIAGNOSIS — Z933 Colostomy status: Secondary | ICD-10-CM | POA: Diagnosis not present

## 2022-08-07 DIAGNOSIS — Z933 Colostomy status: Secondary | ICD-10-CM | POA: Diagnosis not present

## 2022-08-28 DIAGNOSIS — R69 Illness, unspecified: Secondary | ICD-10-CM | POA: Diagnosis not present

## 2022-08-28 DIAGNOSIS — R000 Tachycardia, unspecified: Secondary | ICD-10-CM | POA: Diagnosis not present

## 2022-08-28 DIAGNOSIS — R456 Violent behavior: Secondary | ICD-10-CM | POA: Diagnosis not present

## 2022-08-28 DIAGNOSIS — R4182 Altered mental status, unspecified: Secondary | ICD-10-CM | POA: Diagnosis not present

## 2022-08-28 DIAGNOSIS — I10 Essential (primary) hypertension: Secondary | ICD-10-CM | POA: Diagnosis not present

## 2022-08-29 DIAGNOSIS — R4182 Altered mental status, unspecified: Secondary | ICD-10-CM | POA: Diagnosis not present

## 2022-08-29 DIAGNOSIS — R000 Tachycardia, unspecified: Secondary | ICD-10-CM | POA: Diagnosis not present

## 2022-08-29 DIAGNOSIS — F29 Unspecified psychosis not due to a substance or known physiological condition: Secondary | ICD-10-CM | POA: Diagnosis not present

## 2022-08-29 DIAGNOSIS — R69 Illness, unspecified: Secondary | ICD-10-CM | POA: Diagnosis not present

## 2022-08-30 DIAGNOSIS — R4182 Altered mental status, unspecified: Secondary | ICD-10-CM | POA: Diagnosis not present

## 2022-08-30 DIAGNOSIS — R69 Illness, unspecified: Secondary | ICD-10-CM | POA: Diagnosis not present

## 2022-08-30 DIAGNOSIS — Z87891 Personal history of nicotine dependence: Secondary | ICD-10-CM | POA: Diagnosis not present

## 2022-08-30 DIAGNOSIS — F22 Delusional disorders: Secondary | ICD-10-CM | POA: Diagnosis not present

## 2022-08-30 DIAGNOSIS — I491 Atrial premature depolarization: Secondary | ICD-10-CM | POA: Diagnosis not present

## 2022-08-30 DIAGNOSIS — F29 Unspecified psychosis not due to a substance or known physiological condition: Secondary | ICD-10-CM | POA: Diagnosis not present

## 2022-08-30 DIAGNOSIS — Z933 Colostomy status: Secondary | ICD-10-CM | POA: Diagnosis not present

## 2022-08-30 DIAGNOSIS — F3111 Bipolar disorder, current episode manic without psychotic features, mild: Secondary | ICD-10-CM | POA: Diagnosis not present

## 2022-08-30 DIAGNOSIS — Z639 Problem related to primary support group, unspecified: Secondary | ICD-10-CM | POA: Diagnosis not present

## 2022-08-30 DIAGNOSIS — Z609 Problem related to social environment, unspecified: Secondary | ICD-10-CM | POA: Diagnosis not present

## 2022-08-30 DIAGNOSIS — F319 Bipolar disorder, unspecified: Secondary | ICD-10-CM | POA: Diagnosis not present

## 2022-08-31 DIAGNOSIS — I491 Atrial premature depolarization: Secondary | ICD-10-CM | POA: Diagnosis not present

## 2022-08-31 DIAGNOSIS — R69 Illness, unspecified: Secondary | ICD-10-CM | POA: Diagnosis not present

## 2022-10-05 DIAGNOSIS — F3160 Bipolar disorder, current episode mixed, unspecified: Secondary | ICD-10-CM | POA: Diagnosis not present

## 2022-10-05 DIAGNOSIS — Z933 Colostomy status: Secondary | ICD-10-CM | POA: Diagnosis not present

## 2022-10-05 DIAGNOSIS — K632 Fistula of intestine: Secondary | ICD-10-CM | POA: Diagnosis not present

## 2022-10-09 DIAGNOSIS — Z933 Colostomy status: Secondary | ICD-10-CM | POA: Diagnosis not present

## 2023-03-23 DIAGNOSIS — Z933 Colostomy status: Secondary | ICD-10-CM | POA: Diagnosis not present

## 2023-06-15 DIAGNOSIS — M25571 Pain in right ankle and joints of right foot: Secondary | ICD-10-CM | POA: Diagnosis not present

## 2023-06-15 DIAGNOSIS — L0390 Cellulitis, unspecified: Secondary | ICD-10-CM | POA: Diagnosis not present

## 2023-08-06 DIAGNOSIS — G8929 Other chronic pain: Secondary | ICD-10-CM | POA: Diagnosis not present

## 2023-08-06 DIAGNOSIS — Z7409 Other reduced mobility: Secondary | ICD-10-CM | POA: Diagnosis not present

## 2023-09-11 DIAGNOSIS — Z933 Colostomy status: Secondary | ICD-10-CM | POA: Diagnosis not present

## 2023-10-03 DIAGNOSIS — Z933 Colostomy status: Secondary | ICD-10-CM | POA: Diagnosis not present

## 2023-12-04 DIAGNOSIS — R1031 Right lower quadrant pain: Secondary | ICD-10-CM | POA: Diagnosis not present

## 2023-12-14 DIAGNOSIS — Z933 Colostomy status: Secondary | ICD-10-CM | POA: Diagnosis not present

## 2024-01-04 DIAGNOSIS — Z933 Colostomy status: Secondary | ICD-10-CM | POA: Diagnosis not present

## 2024-01-08 DIAGNOSIS — K649 Unspecified hemorrhoids: Secondary | ICD-10-CM | POA: Diagnosis not present

## 2024-01-31 DIAGNOSIS — Z933 Colostomy status: Secondary | ICD-10-CM | POA: Diagnosis not present

## 2024-02-04 DIAGNOSIS — L7682 Other postprocedural complications of skin and subcutaneous tissue: Secondary | ICD-10-CM | POA: Diagnosis not present

## 2024-02-04 DIAGNOSIS — K632 Fistula of intestine: Secondary | ICD-10-CM | POA: Diagnosis not present

## 2024-03-02 DIAGNOSIS — Z933 Colostomy status: Secondary | ICD-10-CM | POA: Diagnosis not present

## 2024-04-01 DIAGNOSIS — Z933 Colostomy status: Secondary | ICD-10-CM | POA: Diagnosis not present

## 2024-04-03 DIAGNOSIS — K632 Fistula of intestine: Secondary | ICD-10-CM | POA: Diagnosis not present

## 2024-04-03 DIAGNOSIS — Z9049 Acquired absence of other specified parts of digestive tract: Secondary | ICD-10-CM | POA: Diagnosis not present

## 2024-04-03 DIAGNOSIS — Q7649 Other congenital malformations of spine, not associated with scoliosis: Secondary | ICD-10-CM | POA: Diagnosis not present

## 2024-04-12 DIAGNOSIS — R451 Restlessness and agitation: Secondary | ICD-10-CM | POA: Diagnosis not present

## 2024-04-12 DIAGNOSIS — F29 Unspecified psychosis not due to a substance or known physiological condition: Secondary | ICD-10-CM | POA: Diagnosis not present

## 2024-04-12 DIAGNOSIS — R450 Nervousness: Secondary | ICD-10-CM | POA: Diagnosis not present

## 2024-04-12 DIAGNOSIS — R000 Tachycardia, unspecified: Secondary | ICD-10-CM | POA: Diagnosis not present

## 2024-04-12 DIAGNOSIS — R442 Other hallucinations: Secondary | ICD-10-CM | POA: Diagnosis not present

## 2024-04-12 DIAGNOSIS — I10 Essential (primary) hypertension: Secondary | ICD-10-CM | POA: Diagnosis not present

## 2024-04-13 DIAGNOSIS — R451 Restlessness and agitation: Secondary | ICD-10-CM | POA: Diagnosis not present

## 2024-04-14 DIAGNOSIS — Z9049 Acquired absence of other specified parts of digestive tract: Secondary | ICD-10-CM | POA: Diagnosis not present

## 2024-04-14 DIAGNOSIS — F3173 Bipolar disorder, in partial remission, most recent episode manic: Secondary | ICD-10-CM | POA: Diagnosis not present

## 2024-04-14 DIAGNOSIS — E872 Acidosis, unspecified: Secondary | ICD-10-CM | POA: Diagnosis not present

## 2024-04-14 DIAGNOSIS — K632 Fistula of intestine: Secondary | ICD-10-CM | POA: Diagnosis not present

## 2024-04-14 DIAGNOSIS — Z91148 Patient's other noncompliance with medication regimen for other reason: Secondary | ICD-10-CM | POA: Diagnosis not present

## 2024-04-14 DIAGNOSIS — X749XXS Intentional self-harm by unspecified firearm discharge, sequela: Secondary | ICD-10-CM | POA: Diagnosis not present

## 2024-04-14 DIAGNOSIS — R443 Hallucinations, unspecified: Secondary | ICD-10-CM | POA: Diagnosis not present

## 2024-04-14 DIAGNOSIS — F312 Bipolar disorder, current episode manic severe with psychotic features: Secondary | ICD-10-CM | POA: Diagnosis not present

## 2024-04-14 DIAGNOSIS — R451 Restlessness and agitation: Secondary | ICD-10-CM | POA: Diagnosis not present

## 2024-04-14 DIAGNOSIS — R000 Tachycardia, unspecified: Secondary | ICD-10-CM | POA: Diagnosis not present

## 2024-04-14 DIAGNOSIS — F22 Delusional disorders: Secondary | ICD-10-CM | POA: Diagnosis not present

## 2024-04-14 DIAGNOSIS — I10 Essential (primary) hypertension: Secondary | ICD-10-CM | POA: Diagnosis not present

## 2024-04-18 DIAGNOSIS — F319 Bipolar disorder, unspecified: Secondary | ICD-10-CM | POA: Diagnosis not present

## 2024-04-19 DIAGNOSIS — F319 Bipolar disorder, unspecified: Secondary | ICD-10-CM | POA: Diagnosis not present

## 2024-04-20 DIAGNOSIS — F319 Bipolar disorder, unspecified: Secondary | ICD-10-CM | POA: Diagnosis not present

## 2024-04-20 DIAGNOSIS — I10 Essential (primary) hypertension: Secondary | ICD-10-CM | POA: Diagnosis not present

## 2024-04-21 DIAGNOSIS — F319 Bipolar disorder, unspecified: Secondary | ICD-10-CM | POA: Diagnosis not present

## 2024-04-22 DIAGNOSIS — F319 Bipolar disorder, unspecified: Secondary | ICD-10-CM | POA: Diagnosis not present

## 2024-05-02 DIAGNOSIS — Z933 Colostomy status: Secondary | ICD-10-CM | POA: Diagnosis not present

## 2024-07-07 DIAGNOSIS — Z933 Colostomy status: Secondary | ICD-10-CM | POA: Diagnosis not present

## 2024-07-10 DIAGNOSIS — K632 Fistula of intestine: Secondary | ICD-10-CM | POA: Diagnosis not present

## 2024-07-29 DIAGNOSIS — K632 Fistula of intestine: Secondary | ICD-10-CM | POA: Diagnosis not present

## 2024-07-29 DIAGNOSIS — Z980 Intestinal bypass and anastomosis status: Secondary | ICD-10-CM | POA: Diagnosis not present

## 2024-08-07 DIAGNOSIS — K632 Fistula of intestine: Secondary | ICD-10-CM | POA: Diagnosis not present

## 2024-08-07 DIAGNOSIS — K432 Incisional hernia without obstruction or gangrene: Secondary | ICD-10-CM | POA: Diagnosis not present

## 2024-08-31 DIAGNOSIS — I10 Essential (primary) hypertension: Secondary | ICD-10-CM | POA: Diagnosis not present

## 2024-08-31 DIAGNOSIS — F29 Unspecified psychosis not due to a substance or known physiological condition: Secondary | ICD-10-CM | POA: Diagnosis not present

## 2024-08-31 DIAGNOSIS — R442 Other hallucinations: Secondary | ICD-10-CM | POA: Diagnosis not present

## 2024-09-01 DIAGNOSIS — F3113 Bipolar disorder, current episode manic without psychotic features, severe: Secondary | ICD-10-CM | POA: Diagnosis not present

## 2024-09-01 DIAGNOSIS — F29 Unspecified psychosis not due to a substance or known physiological condition: Secondary | ICD-10-CM | POA: Diagnosis not present

## 2024-09-02 DIAGNOSIS — F312 Bipolar disorder, current episode manic severe with psychotic features: Secondary | ICD-10-CM | POA: Diagnosis not present

## 2024-09-02 DIAGNOSIS — Z79899 Other long term (current) drug therapy: Secondary | ICD-10-CM | POA: Diagnosis not present

## 2024-09-03 DIAGNOSIS — Z79899 Other long term (current) drug therapy: Secondary | ICD-10-CM | POA: Diagnosis not present

## 2024-09-03 DIAGNOSIS — F312 Bipolar disorder, current episode manic severe with psychotic features: Secondary | ICD-10-CM | POA: Diagnosis not present

## 2024-09-04 DIAGNOSIS — Z79899 Other long term (current) drug therapy: Secondary | ICD-10-CM | POA: Diagnosis not present

## 2024-09-04 DIAGNOSIS — F312 Bipolar disorder, current episode manic severe with psychotic features: Secondary | ICD-10-CM | POA: Diagnosis not present

## 2024-09-05 DIAGNOSIS — F312 Bipolar disorder, current episode manic severe with psychotic features: Secondary | ICD-10-CM | POA: Diagnosis not present

## 2024-09-05 DIAGNOSIS — Z79899 Other long term (current) drug therapy: Secondary | ICD-10-CM | POA: Diagnosis not present
